# Patient Record
Sex: Female | Born: 1940 | Race: White | Hispanic: No | Marital: Married | Smoking: Former smoker
Health system: Southern US, Community
[De-identification: ages and names within clinical notes are randomized; demographics above are authoritative.]

## PROBLEM LIST (undated history)

## (undated) ENCOUNTER — Ambulatory Visit: Disposition: A | Discharge status: Home / Self Care | Payer: Medicare Other

## (undated) DIAGNOSIS — J449 Chronic obstructive pulmonary disease, unspecified: Secondary | ICD-10-CM

## (undated) DIAGNOSIS — K219 Gastro-esophageal reflux disease without esophagitis: Secondary | ICD-10-CM

## (undated) DIAGNOSIS — M1990 Unspecified osteoarthritis, unspecified site: Secondary | ICD-10-CM

## (undated) DIAGNOSIS — M329 Systemic lupus erythematosus, unspecified: Secondary | ICD-10-CM

## (undated) DIAGNOSIS — K224 Dyskinesia of esophagus: Secondary | ICD-10-CM

## (undated) HISTORY — PX: CHOLECYSTECTOMY: SHX55

## (undated) HISTORY — PX: ABDOMINAL HYSTERECTOMY: SHX81

---

## 2018-11-12 ENCOUNTER — Encounter: Payer: Self-pay | Admitting: Gynecology

## 2018-11-12 ENCOUNTER — Other Ambulatory Visit: Payer: Self-pay

## 2018-11-12 ENCOUNTER — Ambulatory Visit
Admission: EM | Admit: 2018-11-12 | Discharge: 2018-11-12 | Disposition: A | Discharge status: Home / Self Care | Payer: Medicare Other | Attending: Family Medicine | Admitting: Family Medicine

## 2018-11-12 DIAGNOSIS — J441 Chronic obstructive pulmonary disease with (acute) exacerbation: Secondary | ICD-10-CM | POA: Diagnosis not present

## 2018-11-12 DIAGNOSIS — Z87891 Personal history of nicotine dependence: Secondary | ICD-10-CM | POA: Diagnosis not present

## 2018-11-12 HISTORY — DX: Dyskinesia of esophagus: K22.4

## 2018-11-12 HISTORY — DX: Gastro-esophageal reflux disease without esophagitis: K21.9

## 2018-11-12 HISTORY — DX: Systemic lupus erythematosus, unspecified: M32.9

## 2018-11-12 HISTORY — DX: Chronic obstructive pulmonary disease, unspecified: J44.9

## 2018-11-12 HISTORY — DX: Unspecified osteoarthritis, unspecified site: M19.90

## 2018-11-12 MED ORDER — PREDNISONE 50 MG PO TABS: ORAL_TABLET | ORAL | 0 refills | Status: DC

## 2018-11-12 MED ORDER — AZITHROMYCIN 250 MG PO TABS: ORAL_TABLET | ORAL | 0 refills | Status: DC

## 2018-11-12 NOTE — ED Provider Notes (Signed)
[facility]    CSN: [id number] Arrival date & time: 11/12/18  1315  History   Chief Complaint Chief Complaint  Patient presents with  . Cough   HPI  77 year old female with a history of COPD presents with the above complaint.  Symptoms started Thursday.  Patient reports cough, congestion.  Severe. She reports that her cough has been more productive.  She has an increased production of mucus.  She reports associated shortness of breath.  Feels poorly.  No documented fever.  No chills.  She has been using her albuterol inhaler without resolution.  No known exacerbating factors.  No reported sick contacts.  No other associated symptoms.  No other complaints  PMH, Surgical Hx, Family Hx, Social History reviewed and updated as below.  Past Medical History:  Diagnosis Date  . Acid reflux disease   . Arthritis   . COPD (chronic obstructive pulmonary disease) (HCC)   . Esophageal spasm   . Lupus [facility](HCC)    Past Surgical History:  Procedure Laterality Date  . ABDOMINAL HYSTERECTOMY    . CESAREAN SECTION    . CHOLECYSTECTOMY      OB History   None      Home Medications    Prior to Admission medications   Medication Sig Start Date End Date Taking? Authorizing Provider  albuterol (PROVENTIL HFA;VENTOLIN HFA) 108 (90 Base) MCG/ACT inhaler Inhale into the lungs. 03/01/18  Yes [provider]  amLODipine (NORVASC) 5 MG tablet Take by mouth. 04/17/09  Yes [provider]  calcium-vitamin D (OSCAL 500/200 D-3) 500-200 MG-UNIT tablet Take by mouth. 04/17/09  Yes [provider]  Cholecalciferol (VITAMIN D3) 25 MCG (1000 UT) CAPS Take by mouth.   Yes [provider]  fexofenadine (ALLEGRA) 180 MG tablet Take by mouth. 01/03/14  Yes [provider]  hydroxychloroquine (PLAQUENIL) 200 MG tablet Take by mouth. 05/01/13  Yes [provider]  isosorbide mononitrate (IMDUR) 30 MG 24 hr tablet Take by mouth. 04/17/09  Yes [provider]  methotrexate (RHEUMATREX) 2.5 MG tablet Take by mouth. 12/04/13  Yes [provider]  nitroGLYCERIN (NITROSTAT) 0.4 MG SL tablet 1 tablet(s) sublingual as needed for chest pain (may repeat every 5 minutes but seek medical help if pain persists after 3 tablets) 04/17/09  Yes [provider]  azithromycin (ZITHROMAX) 250 MG tablet 2 tablets on day 1, then 1 tablet daily on days 2-5. 11/12/18   Tommie Samsook, Maisen Klingler G, DO  Biotin 1 MG CAPS Take by mouth.    [provider]  predniSONE (DELTASONE) 50 MG tablet 1 tablet daily x 5 days 11/12/18   Tommie Samsook, Ankit Degregorio G, DO    Family History Family History  Problem Relation Age of Onset  . Cancer Mother   . Cancer Father   . Cancer Sister     Social History Social History   Tobacco Use  . Smoking status: Former Games developermoker  . Smokeless tobacco: Never Used  Substance Use Topics  . Alcohol use: Never    Frequency: Never  . Drug use: Never     Allergies   Amoxicillin-pot clavulanate; Aspirin-dipyridamole er; Ciclesonide; Clopidogrel; Codeine; Doxycycline; and Tramadol   Review of Systems Review of Systems  Constitutional: Negative for fever.  Respiratory: Positive for cough and shortness of breath.    Physical Exam Triage Vital Signs ED Triage Vitals  Enc Vitals Group     BP 11/12/18 1346 123/63     Pulse Rate 11/12/18 1346 84  Resp 11/12/18 1346 16     Temp 11/12/18 1346 98.3 F (36.8 C)     Temp Source 11/12/18 1346 Oral     SpO2 11/12/18 1346 96 %     Weight 11/12/18 1338 178 lb (80.7 kg)     Height 11/12/18 1338 5' (1.524 m)     Head Circumference --      Peak Flow --      Pain Score 11/12/18 1338 7     Pain Loc --      Pain Edu? --      Excl. in GC? --    Updated Vital Signs BP 123/63 (BP Location: Left Arm)   Pulse 84   Temp 98.3 F (36.8 C) (Oral)   Resp 16   Ht 5' (1.524 m)   Wt 80.7 kg   SpO2 96%   BMI 34.76 kg/m   Visual Acuity Right Eye Distance:   Left Eye Distance:     Bilateral Distance:    Right Eye Near:   Left Eye Near:    Bilateral Near:     Physical Exam  Constitutional: She is oriented to person, place, and time. She appears well-developed. No distress.  HENT:  Head: Normocephalic and atraumatic.  Eyes: Conjunctivae are normal. Right eye exhibits no discharge. Left eye exhibits no discharge.  Cardiovascular: Normal rate and regular rhythm.  Pulmonary/Chest: Effort normal and breath sounds normal. She has no wheezes. She has no rales.  Neurological: She is alert and oriented to person, place, and time.  Psychiatric: She has a normal mood and affect. Her behavior is normal.  Nursing note and vitals reviewed.  UC Treatments / Results  Labs (all labs ordered are listed, but only abnormal results are displayed) Labs Reviewed - No data to display  EKG None  Radiology No results found.  Procedures Procedures (including critical care time)  Medications Ordered in UC Medications - No data to display  Initial Impression / Assessment and Plan / UC Course  I have reviewed the triage vital signs and the nursing notes.  Pertinent labs & imaging results that were available during my care of the patient were reviewed by me and considered in my medical decision making (see chart for details).    76 year old female presents with COPD exacerbation.  Treating with azithromycin and prednisone.  Final Clinical Impressions(s) / UC Diagnoses   Final diagnoses:  COPD exacerbation [facility])     Discharge Instructions     Medications as prescribed.  Take care  Dr. Adriana Simas    ED Prescriptions    Medication Sig Dispense Auth. Provider   azithromycin (ZITHROMAX) 250 MG tablet 2 tablets on day 1, then 1 tablet daily on days 2-5. 6 tablet Adanna Zuckerman G, DO   predniSONE (DELTASONE) 50 MG tablet 1 tablet daily x 5 days 5 tablet Tommie Sams, DO     Controlled Substance Prescriptions [place] Controlled Substance Registry consulted? Not Applicable    Tommie Sams, DO 11/12/18 1450

## 2018-11-12 NOTE — ED Triage Notes (Signed)
Patient c/o cough / chest congestion x 3 days.

## 2018-11-12 NOTE — Discharge Instructions (Signed)
Medications as prescribed. ° °Take care ° °Dr. Arneta Mahmood  °

## 2019-06-23 ENCOUNTER — Ambulatory Visit
Admission: EM | Admit: 2019-06-23 | Discharge: 2019-06-23 | Disposition: A | Discharge status: Home / Self Care | Payer: Medicare Other | Attending: Family Medicine | Admitting: Family Medicine

## 2019-06-23 ENCOUNTER — Other Ambulatory Visit: Payer: Self-pay

## 2019-06-23 ENCOUNTER — Encounter: Payer: Self-pay | Admitting: Emergency Medicine

## 2019-06-23 DIAGNOSIS — N390 Urinary tract infection, site not specified: Secondary | ICD-10-CM

## 2019-06-23 DIAGNOSIS — R319 Hematuria, unspecified: Secondary | ICD-10-CM

## 2019-06-23 LAB — URINALYSIS, COMPLETE (UACMP) WITH MICROSCOPIC
Bilirubin Urine: NEGATIVE
Glucose, UA: NEGATIVE mg/dL
Ketones, ur: NEGATIVE mg/dL
Nitrite: POSITIVE — AB
Protein, ur: 100 mg/dL — AB
RBC / HPF: >50 RBC/hpf (ref 0–5)
Specific Gravity, Urine: >1.03 — ABNORMAL HIGH (ref 1.005–1.030)
WBC, UA: >50 WBC/hpf (ref 0–5)
pH: 6.5 (ref 5.0–8.0)

## 2019-06-23 MED ORDER — NITROFURANTOIN MONOHYD MACRO 100 MG PO CAPS: 100.0000 mg | ORAL_CAPSULE | Freq: Two times a day (BID) | ORAL | 0 refills | Status: DC

## 2019-06-23 NOTE — Discharge Instructions (Signed)
Increase water intake

## 2019-06-23 NOTE — ED Triage Notes (Signed)
Patient c/o dysuria and urinary urgency that started last night.  

## 2019-06-23 NOTE — ED Provider Notes (Signed)
[facility]    CSN: [id number] Arrival date & time: 06/23/19  1736     History   Chief Complaint Chief Complaint  Patient presents with  . Dysuria  . Urinary Urgency    HPI Laurie Weiss is a 78 y.o. female.   78 yo female with a c/o burning with urination and urinary urgency since last night. Denies any fevers, chills, vomiting, abdominal pain.    Dysuria   Past Medical History:  Diagnosis Date  . Acid reflux disease   . Arthritis   . COPD (chronic obstructive pulmonary disease) ([place])   . Esophageal spasm   . Lupus ([place])     There are no active problems to display for this patient.   Past Surgical History:  Procedure Laterality Date  . ABDOMINAL HYSTERECTOMY    . CESAREAN SECTION    . CHOLECYSTECTOMY      OB History   No obstetric history on file.      Home Medications    Prior to Admission medications   Medication Sig Start Date End Date Taking? Authorizing Provider  albuterol (PROVENTIL HFA;VENTOLIN HFA) 108 (90 Base) MCG/ACT inhaler Inhale into the lungs. 03/01/18  Yes [provider]  amLODipine (NORVASC) 5 MG tablet Take by mouth. 04/17/09  Yes [provider]  azithromycin (ZITHROMAX) 250 MG tablet 2 tablets on day 1, then 1 tablet daily on days 2-5. 11/12/18  Yes Cook, Jayce G, DO  Biotin 1 MG CAPS Take by mouth.   Yes [provider]  calcium-vitamin D (OSCAL 500/200 D-3) 500-200 MG-UNIT tablet Take by mouth. 04/17/09  Yes [provider]  Cholecalciferol (VITAMIN D3) 25 MCG (1000 UT) CAPS Take by mouth.   Yes [provider]  fexofenadine (ALLEGRA) 180 MG tablet Take by mouth. 01/03/14  Yes [provider]  hydroxychloroquine (PLAQUENIL) 200 MG tablet Take by mouth. 05/01/13  Yes [provider]  isosorbide mononitrate (IMDUR) 30 MG 24 hr tablet Take by mouth. 04/17/09  Yes [provider]  methotrexate (RHEUMATREX) 2.5 MG tablet Take by mouth. 12/04/13  Yes [provider]  nitroGLYCERIN (NITROSTAT) 0.4 MG SL tablet 1 tablet(s) sublingual as needed for chest pain (may repeat every 5 minutes but seek medical help if pain persists after 3 tablets) 04/17/09  Yes [provider]  predniSONE (DELTASONE) 50 MG tablet 1 tablet daily x 5 days 11/12/18  Yes Cook, Jayce G, DO  nitrofurantoin, macrocrystal-monohydrate, (MACROBID) 100 MG capsule Take 1 capsule (100 mg total) by mouth 2 (two) times daily. 06/23/19   Norval Gable, MD    Family History Family History  Problem Relation Age of Onset  . Cancer Mother   . Cancer Father   . Cancer Sister     Social History Social History   Tobacco Use  . Smoking status: Former Research scientist (life sciences)  . Smokeless tobacco: Never Used  Substance Use Topics  . Alcohol use: Never    Frequency: Never  . Drug use: Never     Allergies   Amoxicillin-pot clavulanate, Aspirin-dipyridamole er, Ciclesonide, Clopidogrel, Codeine, Doxycycline, and Tramadol   Review of Systems Review of Systems  Genitourinary: Positive for dysuria.     Physical Exam Triage Vital Signs ED Triage Vitals  Enc Vitals Group     BP 06/23/19 1746 134/62     Pulse Rate 06/23/19 1746 83     Resp 06/23/19 1746 18     Temp 06/23/19 1746 98.3 F (36.8 C)     Temp  Source 06/23/19 1746 Oral     SpO2 06/23/19 1746 97 %     Weight 06/23/19 1744 181 lb (82.1 kg)     Height 06/23/19 1744 5' (1.524 m)     Head Circumference --      Peak Flow --      Pain Score 06/23/19 1743 8     Pain Loc --      Pain Edu? --      Excl. in GC? --    No data found.  Updated Vital Signs BP 134/62 (BP Location: Right Arm)   Pulse 83   Temp 98.3 F (36.8 C) (Oral)   Resp 18   Ht 5' (1.524 m)   Wt 82.1 kg   SpO2 97%   BMI 35.35 kg/m   Visual Acuity Right Eye Distance:   Left Eye Distance:   Bilateral Distance:    Right Eye Near:   Left Eye Near:    Bilateral Near:     Physical Exam Vitals signs and nursing note reviewed.  Constitutional:       General: She is not in acute distress.    Appearance: She is not toxic-appearing or diaphoretic.  Abdominal:     General: There is no distension.     Palpations: Abdomen is soft.     Tenderness: There is no abdominal tenderness. There is no right CVA tenderness or left CVA tenderness.  Neurological:     Mental Status: She is alert.      UC Treatments / Results  Labs (all labs ordered are listed, but only abnormal results are displayed) Labs Reviewed  URINALYSIS, COMPLETE (UACMP) WITH MICROSCOPIC - Abnormal; Notable for the following components:      Result Value   APPearance CLOUDY (*)    Specific Gravity, Urine >1.030 (*)    Hgb urine dipstick LARGE (*)    Protein, ur 100 (*)    Nitrite POSITIVE (*)    Leukocytes,Ua LARGE (*)    Bacteria, UA MANY (*)    All other components within normal limits  URINE CULTURE    EKG   Radiology No results found.  Procedures Procedures (including critical care time)  Medications Ordered in UC Medications - No data to display  Initial Impression / Assessment and Plan / UC Course  I have reviewed the triage vital signs and the nursing notes.  Pertinent labs & imaging results that were available during my care of the patient were reviewed by me and considered in my medical decision making (see chart for details).      Final Clinical Impressions(s) / UC Diagnoses   Final diagnoses:  Urinary tract infection with hematuria, site unspecified    ED Prescriptions    Medication Sig Dispense Auth. Provider   nitrofurantoin, macrocrystal-monohydrate, (MACROBID) 100 MG capsule Take 1 capsule (100 mg total) by mouth 2 (two) times daily. 14 capsule Payton Mccallumonty, Neo Yepiz, MD      1. Lab results and diagnosis reviewed with patient 2. rx as per orders above; reviewed possible side effects, interactions, risks and benefits  3. Recommend supportive treatment with increased water intake 4. Follow-up prn if symptoms worsen or don't improve    Controlled Substance Prescriptions [place] Controlled Substance Registry consulted? Not Applicable   Payton Mccallumonty, Blinda Turek, MD 06/23/19 [phone]

## 2019-06-26 LAB — URINE CULTURE
Culture: >=100000 — AB
Special Requests: NORMAL

## 2019-06-28 ENCOUNTER — Telehealth (HOSPITAL_COMMUNITY): Payer: Self-pay | Admitting: Emergency Medicine

## 2019-06-28 MED ORDER — CEPHALEXIN 500 MG PO CAPS: 500.0000 mg | ORAL_CAPSULE | Freq: Two times a day (BID) | ORAL | 0 refills | Status: AC

## 2019-06-28 NOTE — Telephone Encounter (Signed)
Urine culture was positive for e coli resistant to macrobid  given at urgent care visit. Prescription for keflex per Dr.hagler sent to pharmacy of choice. Pt called and made aware. Pt educated to follow up if symptoms are not improving. Verbalized understanding.

## 2019-11-25 ENCOUNTER — Emergency Department: Payer: Medicare Other

## 2019-11-25 ENCOUNTER — Other Ambulatory Visit: Payer: Self-pay

## 2019-11-25 ENCOUNTER — Emergency Department
Admission: EM | Admit: 2019-11-25 | Discharge: 2019-11-26 | Disposition: A | Discharge status: Home / Self Care | Payer: Medicare Other | Attending: Emergency Medicine | Admitting: Emergency Medicine

## 2019-11-25 DIAGNOSIS — Z79899 Other long term (current) drug therapy: Secondary | ICD-10-CM | POA: Insufficient documentation

## 2019-11-25 DIAGNOSIS — R0602 Shortness of breath: Secondary | ICD-10-CM | POA: Diagnosis present

## 2019-11-25 DIAGNOSIS — J441 Chronic obstructive pulmonary disease with (acute) exacerbation: Secondary | ICD-10-CM | POA: Insufficient documentation

## 2019-11-25 DIAGNOSIS — U071 COVID-19: Secondary | ICD-10-CM | POA: Diagnosis not present

## 2019-11-25 DIAGNOSIS — Z87891 Personal history of nicotine dependence: Secondary | ICD-10-CM | POA: Diagnosis not present

## 2019-11-25 DIAGNOSIS — J449 Chronic obstructive pulmonary disease, unspecified: Secondary | ICD-10-CM

## 2019-11-25 LAB — COMPREHENSIVE METABOLIC PANEL
ALT: 26 U/L (ref 0–44)
AST: 39 U/L (ref 15–41)
Albumin: 3.7 g/dL (ref 3.5–5.0)
Alkaline Phosphatase: 52 U/L (ref 38–126)
Anion gap: 8 (ref 5–15)
BUN: 9 mg/dL (ref 8–23)
CO2: 25 mmol/L (ref 22–32)
Calcium: 8.6 mg/dL — ABNORMAL LOW (ref 8.9–10.3)
Chloride: 105 mmol/L (ref 98–111)
Creatinine, Ser: 0.83 mg/dL (ref 0.44–1.00)
GFR calc Af Amer: >60 mL/min (ref >60)
GFR calc non Af Amer: >60 mL/min (ref >60)
Glucose, Bld: 88 mg/dL (ref 70–99)
Potassium: 4 mmol/L (ref 3.5–5.1)
Sodium: 138 mmol/L (ref 135–145)
Total Bilirubin: 1 mg/dL (ref 0.3–1.2)
Total Protein: 7.4 g/dL (ref 6.5–8.1)

## 2019-11-25 LAB — PROTIME-INR
INR: 1 (ref 0.8–1.2)
Prothrombin Time: 13.4 seconds (ref 11.4–15.2)

## 2019-11-25 LAB — CBC WITH DIFFERENTIAL/PLATELET
Abs Immature Granulocytes: 0.01 10*3/uL (ref 0.00–0.07)
Basophils Absolute: 0 10*3/uL (ref 0.0–0.1)
Basophils Relative: 0 %
Eosinophils Absolute: 0 10*3/uL (ref 0.0–0.5)
Eosinophils Relative: 1 %
HCT: 39.1 % (ref 36.0–46.0)
Hemoglobin: 13.2 g/dL (ref 12.0–15.0)
Immature Granulocytes: 0 %
Lymphocytes Relative: 17 %
Lymphs Abs: 0.5 10*3/uL — ABNORMAL LOW (ref 0.7–4.0)
MCH: 30.3 pg (ref 26.0–34.0)
MCHC: 33.8 g/dL (ref 30.0–36.0)
MCV: 89.9 fL (ref 80.0–100.0)
Monocytes Absolute: 0.3 10*3/uL (ref 0.1–1.0)
Monocytes Relative: 10 %
Neutro Abs: 2 10*3/uL (ref 1.7–7.7)
Neutrophils Relative %: 72 %
Platelets: 165 10*3/uL (ref 150–400)
RBC: 4.35 MIL/uL (ref 3.87–5.11)
RDW: 13.9 % (ref 11.5–15.5)
WBC: 2.8 10*3/uL — ABNORMAL LOW (ref 4.0–10.5)
nRBC: 0 % (ref 0.0–0.2)

## 2019-11-25 LAB — POC SARS CORONAVIRUS 2 AG: SARS Coronavirus 2 Ag: POSITIVE — AB

## 2019-11-25 LAB — PROCALCITONIN: Procalcitonin: <0.1 ng/mL

## 2019-11-25 LAB — TROPONIN I (HIGH SENSITIVITY): Troponin I (High Sensitivity): 65 ng/L — ABNORMAL HIGH (ref <18)

## 2019-11-25 LAB — BRAIN NATRIURETIC PEPTIDE: B Natriuretic Peptide: 66 pg/mL (ref 0.0–100.0)

## 2019-11-25 MED ORDER — SODIUM CHLORIDE 0.9 % IV BOLUS
500.0000 mL | Freq: Once | INTRAVENOUS | Status: AC
  Administered 2019-11-25: 20:00:00 500 mL via INTRAVENOUS

## 2019-11-25 MED ORDER — ALBUTEROL SULFATE HFA 108 (90 BASE) MCG/ACT IN AERS: 1.0000 | INHALATION_SPRAY | Freq: Four times a day (QID) | RESPIRATORY_TRACT | 1 refills | Status: DC | PRN

## 2019-11-25 MED ORDER — BENZONATATE 100 MG PO CAPS
100.0000 mg | ORAL_CAPSULE | Freq: Once | ORAL | Status: AC
  Administered 2019-11-25: 100 mg via ORAL
  Filled 2019-11-25: qty 1

## 2019-11-25 MED ORDER — PREDNISONE 20 MG PO TABS: 40.0000 mg | ORAL_TABLET | Freq: Every day | ORAL | 0 refills | Status: AC

## 2019-11-25 MED ORDER — BENZONATATE 100 MG PO CAPS: 100.0000 mg | ORAL_CAPSULE | Freq: Three times a day (TID) | ORAL | 0 refills | Status: AC | PRN

## 2019-11-25 NOTE — ED Notes (Signed)
Pt up and ambulated around in the room on room air and O2 sats were 94 to 95 %. Dr Jari Pigg informed. Pt did not feel short of breath with walking.

## 2019-11-25 NOTE — ED Notes (Signed)
Repeat lt green and blue sent to lab

## 2019-11-25 NOTE — Discharge Instructions (Addendum)
You are positive for coronavirus.  You should stay quarantine at home for 10 days.  Take Tylenol to help with your fevers and aches.  You can use the tessalon pearles to help with cough. We are starting you on some prednisone given your COPD as well as a inhaler for your COPD.  Return to the ER if you develop worsening shortness of breath or have any other concerns.     Person Under Monitoring Name: Laurie Weiss  Location: [address] [place] [id number]   Infection Prevention Recommendations for Individuals Confirmed to have, or Being Evaluated for, 2019 Novel Coronavirus (COVID-19) Infection Who Receive Care at Home  Individuals who are confirmed to have, or are being evaluated for, COVID-19 should follow the prevention steps below until a healthcare provider or local or state health department says they can return to normal activities.  Stay home except to get medical care You should restrict activities outside your home, except for getting medical care. Do not go to work, school, or public areas, and do not use public transportation or taxis.  Call ahead before visiting your doctor Before your medical appointment, call the healthcare provider and tell them that you have, or are being evaluated for, COVID-19 infection. This will help the healthcare provider's office take steps to keep other people from getting infected. Ask your healthcare provider to call the local or state health department.  Monitor your symptoms Seek prompt medical attention if your illness is worsening (e.g., difficulty breathing). Before going to your medical appointment, call the healthcare provider and tell them that you have, or are being evaluated for, COVID-19 infection. Ask your healthcare provider to call the local or state health department.  Wear a facemask You should wear a facemask that covers your nose and mouth when you are in the same room with other people and when you visit a  healthcare provider. People who live with or visit you should also wear a facemask while they are in the same room with you.  Separate yourself from other people in your home As much as possible, you should stay in a different room from other people in your home. Also, you should use a separate bathroom, if available.  Avoid sharing household items You should not share dishes, drinking glasses, cups, eating utensils, towels, bedding, or other items with other people in your home. After using these items, you should wash them thoroughly with soap and water.  Cover your coughs and sneezes Cover your mouth and nose with a tissue when you cough or sneeze, or you can cough or sneeze into your sleeve. Throw used tissues in a lined trash can, and immediately wash your hands with soap and water for at least 20 seconds or use an alcohol-based hand rub.  Wash your [employer] Pacific Corporationhands Wash your hands often and thoroughly with soap and water for at least 20 seconds. You can use an alcohol-based hand sanitizer if soap and water are not available and if your hands are not visibly dirty. Avoid touching your eyes, nose, and mouth with unwashed hands.   Prevention Steps for Caregivers and Household Members of Individuals Confirmed to have, or Being Evaluated for, COVID-19 Infection Being Cared for in the Home  If you live with, or provide care at home for, a person confirmed to have, or being evaluated for, COVID-19 infection please follow these guidelines to prevent infection:  Follow healthcare provider's instructions Make sure that you understand and can help the patient follow  any healthcare provider instructions for all care.  Provide for the patient's basic needs You should help the patient with basic needs in the home and provide support for getting groceries, prescriptions, and other personal needs.  Monitor the patient's symptoms If they are getting sicker, call his or her medical provider and tell  them that the patient has, or is being evaluated for, COVID-19 infection. This will help the healthcare provider's office take steps to keep other people from getting infected. Ask the healthcare provider to call the local or state health department.  Limit the number of people who have contact with the patient If possible, have only one caregiver for the patient. Other household members should stay in another home or place of residence. If this is not possible, they should stay in another room, or be separated from the patient as much as possible. Use a separate bathroom, if available. Restrict visitors who do not have an essential need to be in the home.  Keep older adults, very young children, and other sick people away from the patient Keep older adults, very young children, and those who have compromised immune systems or chronic health conditions away from the patient. This includes people with chronic heart, lung, or kidney conditions, diabetes, and cancer.  Ensure good ventilation Make sure that shared spaces in the home have good air flow, such as from an air conditioner or an opened window, weather permitting.  Wash your hands often Wash your hands often and thoroughly with soap and water for at least 20 seconds. You can use an alcohol based hand sanitizer if soap and water are not available and if your hands are not visibly dirty. Avoid touching your eyes, nose, and mouth with unwashed hands. Use disposable paper towels to dry your hands. If not available, use dedicated cloth towels and replace them when they become wet.  Wear a facemask and gloves Wear a disposable facemask at all times in the room and gloves when you touch or have contact with the patient's blood, body fluids, and/or secretions or excretions, such as sweat, saliva, sputum, nasal mucus, vomit, urine, or feces.  Ensure the mask fits over your nose and mouth tightly, and do not touch it during use. Throw out  disposable facemasks and gloves after using them. Do not reuse. Wash your hands immediately after removing your facemask and gloves. If your personal clothing becomes contaminated, carefully remove clothing and launder. Wash your hands after handling contaminated clothing. Place all used disposable facemasks, gloves, and other waste in a lined container before disposing them with other household waste. Remove gloves and wash your hands immediately after handling these items.  Do not share dishes, glasses, or other household items with the patient Avoid sharing household items. You should not share dishes, drinking glasses, cups, eating utensils, towels, bedding, or other items with a patient who is confirmed to have, or being evaluated for, COVID-19 infection. After the person uses these items, you should wash them thoroughly with soap and water.  Wash laundry thoroughly Immediately remove and wash clothes or bedding that have blood, body fluids, and/or secretions or excretions, such as sweat, saliva, sputum, nasal mucus, vomit, urine, or feces, on them. Wear gloves when handling laundry from the patient. Read and follow directions on labels of laundry or clothing items and detergent. In general, wash and dry with the warmest temperatures recommended on the label.  Clean all areas the individual has used often Clean all touchable surfaces, such as counters,  tabletops, doorknobs, bathroom fixtures, toilets, phones, keyboards, tablets, and bedside tables, every day. Also, clean any surfaces that may have blood, body fluids, and/or secretions or excretions on them. Wear gloves when cleaning surfaces the patient has come in contact with. Use a diluted bleach solution (e.g., dilute bleach with 1 part bleach and 10 parts water) or a household disinfectant with a label that says EPA-registered for coronaviruses. To make a bleach solution at home, add 1 tablespoon of bleach to 1 quart (4 cups) of water.  For a larger supply, add  cup of bleach to 1 gallon (16 cups) of water. Read labels of cleaning products and follow recommendations provided on product labels. Labels contain instructions for safe and effective use of the cleaning product including precautions you should take when applying the product, such as wearing gloves or eye protection and making sure you have good ventilation during use of the product. Remove gloves and wash hands immediately after cleaning.  Monitor yourself for signs and symptoms of illness Caregivers and household members are considered close contacts, should monitor their health, and will be asked to limit movement outside of the home to the extent possible. Follow the monitoring steps for close contacts listed on the symptom monitoring form.   ? If you have additional questions, contact your local health department or call the epidemiologist on call at [phone] (available 24/7). ? This guidance is subject to change. For the most up-to-date guidance from [facility], please refer to their website: [email or web address]

## 2019-11-25 NOTE — ED Triage Notes (Signed)
Pt arrives from home via EMS after having SHOB startind today- pt's husband tested positive on Dec 3rd but pt tested negative- pt states she has had a cough, SHOB, loss of taste and smell

## 2019-11-25 NOTE — ED Notes (Addendum)
Call bell answered, pt assisted to toilet, diaper changed, infusion completed, tv on and lights dimmed for comfort

## 2019-11-25 NOTE — ED Notes (Signed)
X-ray at bedside

## 2019-11-25 NOTE — ED Provider Notes (Signed)
[facility] Emergency Department Provider Note  ____________________________________________   First MD Initiated Contact with Patient 11/25/19 1629     (approximate)  I have reviewed the triage vital signs and the nursing notes.   HISTORY  Chief Complaint Shortness of Breath    HPI Laurie Weiss is a 78 y.o. female with COPD, lupus, rheumatoid arthritis who comes in for shortness of breath.  Patient states she is on methotrexate but she takes that once a week.  She otherwise is not on chronic steroids.  Patient husband tested positive on December 3 but patient's test was negative.  Patient states that she lost the sense of taste and smell.  She also has associated cough and shortness of breath.  She states she started to feel like she had the flu on December 5 or 6.  She presented today due to a coughing fit.  She states that about 2 hours prior to presentation that she started coughing and she felt really short of breath and she could not catch it which is why she presented.  The shortness of breath was severe, occurred at one time, lasted few minutes and then resolved.  She states now she denies any shortness of breath or chest pain or coughing.  She denies any abdominal pain.          Past Medical History:  Diagnosis Date  . Acid reflux disease   . Arthritis   . COPD (chronic obstructive pulmonary disease) ([place])   . Esophageal spasm   . Lupus ([place])     There are no problems to display for this patient.   Past Surgical History:  Procedure Laterality Date  . ABDOMINAL HYSTERECTOMY    . CESAREAN SECTION    . CHOLECYSTECTOMY      Prior to Admission medications   Medication Sig Start Date End Date Taking? Authorizing Provider  albuterol (PROVENTIL HFA;VENTOLIN HFA) 108 (90 Base) MCG/ACT inhaler Inhale into the lungs. 03/01/18   [provider]  amLODipine (NORVASC) 5 MG tablet Take by mouth. 04/17/09   [provider]    azithromycin (ZITHROMAX) 250 MG tablet 2 tablets on day 1, then 1 tablet daily on days 2-5. 11/12/18   Coral Spikes, DO  Biotin 1 MG CAPS Take by mouth.    [provider]  calcium-vitamin D (OSCAL 500/200 D-3) 500-200 MG-UNIT tablet Take by mouth. 04/17/09   [provider]  Cholecalciferol (VITAMIN D3) 25 MCG (1000 UT) CAPS Take by mouth.    [provider]  fexofenadine (ALLEGRA) 180 MG tablet Take by mouth. 01/03/14   [provider]  hydroxychloroquine (PLAQUENIL) 200 MG tablet Take by mouth. 05/01/13   [provider]  isosorbide mononitrate (IMDUR) 30 MG 24 hr tablet Take by mouth. 04/17/09   [provider]  methotrexate (RHEUMATREX) 2.5 MG tablet Take by mouth. 12/04/13   [provider]  nitrofurantoin, macrocrystal-monohydrate, (MACROBID) 100 MG capsule Take 1 capsule (100 mg total) by mouth 2 (two) times daily. 06/23/19   Norval Gable, MD  nitroGLYCERIN (NITROSTAT) 0.4 MG SL tablet 1 tablet(s) sublingual as needed for chest pain (may repeat every 5 minutes but seek medical help if pain persists after 3 tablets) 04/17/09   [provider]  predniSONE (DELTASONE) 50 MG tablet 1 tablet daily x 5 days 11/12/18   Coral Spikes, DO    Allergies Amoxicillin-pot clavulanate, Aspirin-dipyridamole er, Ciclesonide, Clopidogrel, Codeine, Doxycycline, and Tramadol  Family History  Problem Relation Age of  Onset  . Cancer Mother   . Cancer Father   . Cancer Sister     Social History Social History   Tobacco Use  . Smoking status: Former Games developermoker  . Smokeless tobacco: Never Used  Substance Use Topics  . Alcohol use: Never  . Drug use: Never      Review of Systems Constitutional: No fever/chills Eyes: No visual changes. ENT: No sore throat. Cardiovascular: No chest pain Respiratory: Positive for SOB, coughing Gastrointestinal: No abdominal pain.  No nausea, no vomiting.  No diarrhea.  No  constipation. Genitourinary: Negative for dysuria. Musculoskeletal: Negative for back pain. Skin: Negative for rash. Neurological: Negative for headaches, focal weakness or numbness. All other ROS negative ____________________________________________   PHYSICAL EXAM:  VITAL SIGNS: ED Triage Vitals  Enc Vitals Group     BP 11/25/19 1625 124/66     Pulse Rate 11/25/19 1615 90     Resp 11/25/19 1615 (!) 27     Temp 11/25/19 1615 98.6 F (37 C)     Temp Source 11/25/19 1615 Oral     SpO2 11/25/19 1612 97 %     Weight 11/25/19 1616 172 lb (78 kg)     Height 11/25/19 1616 5' (1.524 m)     Head Circumference --      Peak Flow --      Pain Score 11/25/19 1616 0     Pain Loc --      Pain Edu? --      Excl. in GC? --     Constitutional: Alert and oriented. Well appearing and in no acute distress. Eyes: Conjunctivae are normal. EOMI. Head: Atraumatic. Nose: No congestion/rhinnorhea. Mouth/Throat: Mucous membranes are moist.   Neck: No stridor. Trachea Midline. FROM Cardiovascular: Normal rate, regular rhythm. Grossly normal heart sounds.  Good peripheral circulation. Respiratory: No wheezing, no increased work of breathing, on room air Gastrointestinal: Soft and nontender. No distention. No abdominal bruits.  Musculoskeletal: No lower extremity tenderness nor edema.  No joint effusions. Neurologic:  Normal speech and language. No gross focal neurologic deficits are appreciated.  Skin:  Skin is warm, dry and intact. No rash noted. Psychiatric: Mood and affect are normal. Speech and behavior are normal. GU: Deferred   ____________________________________________   LABS (all labs ordered are listed, but only abnormal results are displayed)  Labs Reviewed  CBC WITH DIFFERENTIAL/PLATELET - Abnormal; Notable for the following components:      Result Value   WBC 2.8 (*)    Lymphs Abs 0.5 (*)    All other components within normal limits  COMPREHENSIVE METABOLIC PANEL - Abnormal;  Notable for the following components:   Calcium 8.6 (*)    All other components within normal limits  POC SARS CORONAVIRUS 2 AG - Abnormal; Notable for the following components:   SARS Coronavirus 2 Ag POSITIVE (*)    All other components within normal limits  TROPONIN I (HIGH SENSITIVITY) - Abnormal; Notable for the following components:   Troponin I (High Sensitivity) 65 (*)    All other components within normal limits  BRAIN NATRIURETIC PEPTIDE  PROTIME-INR  PROCALCITONIN  PROCALCITONIN  POC SARS CORONAVIRUS 2 AG -  ED  TROPONIN I (HIGH SENSITIVITY)   ____________________________________________   ED ECG REPORT I, Concha SeMary E Romina Divirgilio, the attending physician, personally viewed and interpreted this ECG.  EKG is normal sinus rate of 90, no ST elevations, T wave inversion aVL, normal intervals ____________________________________________  RADIOLOGY Vela ProseI, Tabria Steines E Vong Garringer, personally viewed and evaluated  these images (plain radiographs) as part of my medical decision making, as well as reviewing the written report by the radiologist.  ED MD interpretation: Bilateral opacities consistent with Covid.  Official radiology report(s): DG Chest Portable 1 View  Result Date: 11/25/2019 CLINICAL DATA:  SOB starting today, cough. Per ER notes: husband tested positive on Dec 3rd but pt tested negative. HX of COPD, former smoker. EXAM: PORTABLE CHEST 1 VIEW COMPARISON:  None. FINDINGS: Normal mediastinal contours. Heart size is upper limits of normal. Aortic arch calcification. There are diffuse bilateral coarse interstitial opacities likely reflecting chronic bronchitis. No focal infiltrate. No pneumothorax or large pleural effusion. No acute finding in the visualized skeleton. IMPRESSION: Diffuse bilateral coarse interstitial opacities likely reflecting chronic bronchitis, though a superimposed acute interstitial/viral process would be difficult to exclude. No focal infiltrate. Electronically Signed   By:  Emmaline Kluver M.D.   On: 11/25/2019 17:24    ____________________________________________   PROCEDURES  Procedure(s) performed (including Critical Care):  Procedures   ____________________________________________   INITIAL IMPRESSION / ASSESSMENT AND PLAN / ED COURSE   Haydan Wedig was evaluated in Emergency Department on 11/25/2019 for the symptoms described in the history of present illness. She was evaluated in the context of the global COVID-19 pandemic, which necessitated consideration that the patient might be at risk for infection with the SARS-CoV-2 virus that causes COVID-19. Institutional protocols and algorithms that pertain to the evaluation of patients at risk for COVID-19 are in a state of rapid change based on information released by regulatory bodies including the CDC and federal and state organizations. These policies and algorithms were followed during the patient's care in the ED.     Pt presents with SOB.  This is most concerning for coronavirus. PNA-will get xray to evaluation Anemia-CBC to evaluate ACS- will get trops Arrhythmia-Will get EKG and keep on monitor.  PE-lower suspicion given no risk factors and other cause more likely   Chest x-ray consistent with Covid.  Ambulatory sat was greater than 94%.  Patient's white count was slightly low but her procalcitonin was negative and she is afebrile so I do not think she needs antibiotics.  Her troponin was elevated at 65 although her EKG had no changes.  Will get repeat to make sure that it stable.  More likely just secondary to infectious cause unless greatly increasing.  I reevaluated patient she is having no chest pain or shortness of breath.  She feels comfortable going home.  She is requesting a refill of her inhaler for her COPD.  Also give a short course of prednisone given she has COPD.   Patient handed off pending repeat troponin and if stable discharge home.     ____________________________________________   FINAL CLINICAL IMPRESSION(S) / ED DIAGNOSES   Final diagnoses:  COVID-19 virus detected  Chronic obstructive pulmonary disease, unspecified COPD type (HCC)     MEDICATIONS GIVEN DURING THIS VISIT:  Medications  benzonatate (TESSALON) capsule 100 mg (100 mg Oral Given 11/25/19 1753)  sodium chloride 0.9 % bolus 500 mL (0 mLs Intravenous Stopped 11/25/19 2150)     ED Discharge Orders         Ordered    predniSONE (DELTASONE) 20 MG tablet  Daily     11/25/19 2238    benzonatate (TESSALON PERLES) 100 MG capsule  3 times daily PRN     11/25/19 2238    albuterol (VENTOLIN HFA) 108 (90 Base) MCG/ACT inhaler  Every 6 hours PRN  11/25/19 2238           Note:  This document was prepared using Dragon voice recognition software and may include unintentional dictation errors.   Concha Se, MD 11/25/19 2241

## 2019-11-25 NOTE — ED Notes (Signed)
Pt given blankets and brief changed

## 2019-11-26 LAB — TROPONIN I (HIGH SENSITIVITY): Troponin I (High Sensitivity): 65 ng/L — ABNORMAL HIGH (ref <18)

## 2019-11-26 NOTE — ED Provider Notes (Signed)
-----------------------------------------   12:39 AM on 11/26/2019 -----------------------------------------  Repeat troponin remains unchanged.  Will discharge home per previous provider.  Strict return precautions given.  Patient verbalizes understanding and agrees with plan of care.   Paulette Blanch, MD 11/26/19 [phone]

## 2019-11-26 NOTE — ED Notes (Signed)
Patients son called to pick up pt

## 2020-12-26 ENCOUNTER — Ambulatory Visit
Admission: EM | Admit: 2020-12-26 | Discharge: 2020-12-26 | Disposition: A | Discharge status: Home / Self Care | Payer: Medicare Other | Attending: Physician Assistant | Admitting: Physician Assistant

## 2020-12-26 ENCOUNTER — Encounter: Payer: Self-pay | Admitting: Emergency Medicine

## 2020-12-26 ENCOUNTER — Other Ambulatory Visit: Payer: Self-pay

## 2020-12-26 ENCOUNTER — Ambulatory Visit (INDEPENDENT_AMBULATORY_CARE_PROVIDER_SITE_OTHER): Payer: Medicare Other

## 2020-12-26 DIAGNOSIS — U071 COVID-19: Secondary | ICD-10-CM | POA: Insufficient documentation

## 2020-12-26 DIAGNOSIS — R059 Cough, unspecified: Secondary | ICD-10-CM

## 2020-12-26 DIAGNOSIS — R0602 Shortness of breath: Secondary | ICD-10-CM

## 2020-12-26 DIAGNOSIS — R0981 Nasal congestion: Secondary | ICD-10-CM

## 2020-12-26 DIAGNOSIS — J441 Chronic obstructive pulmonary disease with (acute) exacerbation: Secondary | ICD-10-CM | POA: Diagnosis not present

## 2020-12-26 MED ORDER — ALBUTEROL SULFATE HFA 108 (90 BASE) MCG/ACT IN AERS: 1.0000 | INHALATION_SPRAY | Freq: Four times a day (QID) | RESPIRATORY_TRACT | 1 refills | Status: AC | PRN

## 2020-12-26 MED ORDER — AZITHROMYCIN 250 MG PO TABS: ORAL_TABLET | ORAL | 0 refills | Status: DC

## 2020-12-26 MED ORDER — PREDNISONE 20 MG PO TABS: 40.0000 mg | ORAL_TABLET | Freq: Every day | ORAL | 0 refills | Status: AC

## 2020-12-26 NOTE — ED Provider Notes (Addendum)
[facility]    CSN: [id number] Arrival date & time: 12/26/20  1020      History   Chief Complaint Chief Complaint  Patient presents with  . Cough  . Shortness of Breath    HPI Laurie Weiss is a 80 y.o. female presenting for 1 week history of dry cough and increased SOB. She has history of COPD, lupus, hypertension, hyperlipidemia, RA, and GERD with esophageal spasms. Patient admits to personal history of COVID 90 in December 2020. Has been fully vaccinated for COVID.  Patient says that she does not have an albuterol inhaler and requests a refill today.  Patient states that she does not feel that badly right now, but since she has COPD she is afraid that she could get worse.  Denies any fever, body aches, chest pain, sore throat, nasal congestion, abdominal pain, nausea/vomiting or diarrhea.  She does not report any changes in smell and taste.  She has been taking over-the-counter cough syrup with some improvement in her cough.  She has no other complaints or concerns.  HPI  Past Medical History:  Diagnosis Date  . Acid reflux disease   . Arthritis   . COPD (chronic obstructive pulmonary disease) (HCC)   . Esophageal spasm   . Lupus (HCC)     There are no problems to display for this patient.   Past Surgical History:  Procedure Laterality Date  . ABDOMINAL HYSTERECTOMY    . CESAREAN SECTION    . CHOLECYSTECTOMY      OB History   No obstetric history on file.      Home Medications    Prior to Admission medications   Medication Sig Start Date End Date Taking? Authorizing Provider  predniSONE (DELTASONE) 20 MG tablet Take 2 tablets (40 mg total) by mouth daily for 5 days. 12/26/20 12/31/20 Yes Shirlee Latch, PA-C  albuterol (VENTOLIN HFA) 108 (90 Base) MCG/ACT inhaler Inhale 1-2 puffs into the lungs every 6 (six) hours as needed for wheezing or shortness of breath. 12/26/20 01/25/21  Eusebio Friendly B, PA-C  amLODipine (NORVASC) 5 MG tablet Take by mouth.  04/17/09   [provider]  azithromycin (ZITHROMAX) 250 MG tablet 2 tablets on day 1, then 1 tablet daily on days 2-5. 12/26/20   Shirlee Latch, PA-C  Biotin 1 MG CAPS Take by mouth.    [provider]  calcium-vitamin D (OSCAL 500/200 D-3) 500-200 MG-UNIT tablet Take by mouth. 04/17/09   [provider]  Cholecalciferol (VITAMIN D3) 25 MCG (1000 UT) CAPS Take by mouth.    [provider]  fexofenadine (ALLEGRA) 180 MG tablet Take by mouth. 01/03/14   [provider]  hydroxychloroquine (PLAQUENIL) 200 MG tablet Take by mouth. 05/01/13   [provider]  isosorbide mononitrate (IMDUR) 30 MG 24 hr tablet Take by mouth. 04/17/09   [provider]  methotrexate (RHEUMATREX) 2.5 MG tablet Take by mouth. 12/04/13   [provider]  nitrofurantoin, macrocrystal-monohydrate, (MACROBID) 100 MG capsule Take 1 capsule (100 mg total) by mouth 2 (two) times daily. 06/23/19   Payton Mccallum, MD  nitroGLYCERIN (NITROSTAT) 0.4 MG SL tablet 1 tablet(s) sublingual as needed for chest pain (may repeat every 5 minutes but seek medical help if pain persists after 3 tablets) 04/17/09   [provider]    Family History Family History  Problem Relation Age of Onset  . Cancer Mother   . Cancer Father   . Cancer Sister  Social History Social History   Tobacco Use  . Smoking status: Former Games developer  . Smokeless tobacco: Never Used  Vaping Use  . Vaping Use: Never used  Substance Use Topics  . Alcohol use: Never  . Drug use: Never     Allergies   Amoxicillin-pot clavulanate, Aspirin-dipyridamole er, Ciclesonide, Clopidogrel, Codeine, Doxycycline, and Tramadol   Review of Systems Review of Systems  Constitutional: Positive for fatigue. Negative for chills, diaphoresis and fever.  HENT: Negative for congestion, ear pain, rhinorrhea, sinus pressure, sinus pain and sore throat.   Respiratory: Positive for cough and shortness of  breath. Negative for wheezing.   Cardiovascular: Negative for chest pain.  Gastrointestinal: Negative for abdominal pain, nausea and vomiting.  Musculoskeletal: Negative for arthralgias and myalgias.  Skin: Negative for rash.  Neurological: Negative for weakness and headaches.  Hematological: Negative for adenopathy.     Physical Exam Triage Vital Signs ED Triage Vitals  Enc Vitals Group     BP 12/26/20 1054 (!) 155/69     Pulse Rate 12/26/20 1054 65     Resp 12/26/20 1054 15     Temp 12/26/20 1054 97.9 F (36.6 C)     Temp Source 12/26/20 1054 Oral     SpO2 12/26/20 1054 95 %     Weight 12/26/20 1051 170 lb (77.1 kg)     Height 12/26/20 1051 5' (1.524 m)     Head Circumference --      Peak Flow --      Pain Score 12/26/20 1051 0     Pain Loc --      Pain Edu? --      Excl. in GC? --    No data found.  Updated Vital Signs BP (!) 155/69 (BP Location: Left Arm)   Pulse 65   Temp 97.9 F (36.6 C) (Oral)   Resp 15   Ht 5' (1.524 m)   Wt 170 lb (77.1 kg)   SpO2 95%   BMI 33.20 kg/m    Physical Exam Vitals and nursing note reviewed.  Constitutional:      General: She is not in acute distress.    Appearance: Normal appearance. She is not ill-appearing or toxic-appearing.  HENT:     Head: Normocephalic and atraumatic.     Right Ear: Tympanic membrane, ear canal and external ear normal.     Left Ear: Tympanic membrane, ear canal and external ear normal.     Nose: Nose normal.     Mouth/Throat:     Mouth: Mucous membranes are moist.     Pharynx: Oropharynx is clear.  Eyes:     General: No scleral icterus.       Right eye: No discharge.        Left eye: No discharge.     Conjunctiva/sclera: Conjunctivae normal.  Cardiovascular:     Rate and Rhythm: Normal rate and regular rhythm.     Heart sounds: Normal heart sounds.  Pulmonary:     Effort: Pulmonary effort is normal. No respiratory distress.     Breath sounds: Normal breath sounds. No wheezing, rhonchi or  rales.  Musculoskeletal:     Cervical back: Neck supple.  Skin:    General: Skin is dry.  Neurological:     General: No focal deficit present.     Mental Status: She is alert. Mental status is at baseline.     Motor: No weakness.     Gait: Gait normal.  Psychiatric:  Mood and Affect: Mood normal.        Behavior: Behavior normal.        Thought Content: Thought content normal.      UC Treatments / Results  Labs (all labs ordered are listed, but only abnormal results are displayed) Labs Reviewed  SARS CORONAVIRUS 2 (TAT 6-24 HRS)    EKG   Radiology DG Chest 2 View  Result Date: 12/26/2020 CLINICAL DATA:  Cough and congestion with shortness of breath EXAM: CHEST - 2 VIEW COMPARISON:  November 25, 2019 FINDINGS: There is minimal scarring in the left base. The lungs elsewhere are clear. Heart size and pulmonary vascularity are normal. No adenopathy. There is aortic atherosclerosis. There is mild degenerative change in the thoracic spine. IMPRESSION: Slight scarring left base. No edema or airspace opacity. Heart size normal. Aortic Atherosclerosis (ICD10-I70.0). Electronically Signed   By: Bretta Bang III M.D.   On: 12/26/2020 11:36    Procedures Procedures (including critical care time)  Medications Ordered in UC Medications - No data to display  Initial Impression / Assessment and Plan / UC Course  I have reviewed the triage vital signs and the nursing notes.  Pertinent labs & imaging results that were available during my care of the patient were reviewed by me and considered in my medical decision making (see chart for details).   80 year old female presenting for nearly 1 week history of cough and increased shortness of breath.  Has history of COPD and does not have an inhaler.  She is in no acute distress.  Blood pressure slightly elevated at 155/69.  Exam is benign.  Chest clear to auscultation heart regular rate and rhythm.  Chest x-ray performed due to  complaint of shortness of breath. Independently reviewed by me. Chest x-ray does not show any acute cardiopulmonary abnormality.  COVID testing performed.  Patient aware how to access results.  Current CDC guidance, isolation protocol and ED precautions reviewed with patient.  Advised supportive care with increasing rest and fluids and continue the cough syrup that she has been taking over-the-counter.  Sent azithromycin and ProAir inhaler to pharmacy since she does have history of COPD and may be having mild COPD exacerbation.  I did pend the prednisone because she is reluctant to take that medication since she states that she was on it for a long time due to her lupus and rheumatoid arthritis and tries not to take it.  Advised her to pick this medication up if she is not feeling any better over the next few days ER for symptoms worsen.  Return to ED precautions reviewed with patient.  Final Clinical Impressions(s) / UC Diagnoses   Final diagnoses:  COPD exacerbation (HCC)  Cough  Shortness of breath     Discharge Instructions     You have received COVID testing today either for positive exposure, concerning symptoms that could be related to COVID infection, screening purposes, or re-testing after confirmed positive.  Your test obtained today checks for active viral infection in the last 1-2 weeks. If your test is negative now, you can still test positive later. So, if you do develop symptoms you should either get re-tested and/or isolate x 5 days and then strict mask use x 5 days (unvaccinated) or mask use x 10 days (vaccinated). Please follow CDC guidelines.  While Rapid antigen tests come back in 15-20 minutes, send out PCR/molecular test results typically come back within 1-3 days. In the mean time, if you are symptomatic, assume this  could be a positive test and treat/monitor yourself as if you do have COVID.   We will call with test results if positive. Please download the MyChart app and  set up a profile to access test results.   If symptomatic, go home and rest. Push fluids. Take Tylenol as needed for discomfort. Gargle warm salt water. Throat lozenges. Take Mucinex DM or Robitussin for cough. Humidifier in bedroom to ease coughing. Warm showers. Also review the COVID handout for more information.  COVID-19 INFECTION: The incubation period of COVID-19 is approximately 14 days after exposure, with most symptoms developing in roughly 4-5 days. Symptoms may range in severity from mild to critically severe. Roughly 80% of those infected will have mild symptoms. People of any age may become infected with COVID-19 and have the ability to transmit the virus. The most common symptoms include: fever, fatigue, cough, body aches, headaches, sore throat, nasal congestion, shortness of breath, nausea, vomiting, diarrhea, changes in smell and/or taste.    COURSE OF ILLNESS Some patients may begin with mild disease which can progress quickly into critical symptoms. If your symptoms are worsening please call ahead to the Emergency Department and proceed there for further treatment. Recovery time appears to be roughly 1-2 weeks for mild symptoms and 3-6 weeks for severe disease.   GO IMMEDIATELY TO ER FOR FEVER YOU ARE UNABLE TO GET DOWN WITH TYLENOL, BREATHING PROBLEMS, CHEST PAIN, FATIGUE, LETHARGY, INABILITY TO EAT OR DRINK, ETC  QUARANTINE AND ISOLATION: To help decrease the spread of COVID-19 please remain isolated if you have COVID infection or are highly suspected to have COVID infection. This means -stay home and isolate to one room in the home if you live with others. Do not share a bed or bathroom with others while ill, sanitize and wipe down all countertops and keep common areas clean and disinfected. Stay home for 5 days. If you have no symptoms or your symptoms are resolving after 5 days, you can leave your house. Continue to wear a mask around others for 5 additional days. If you have been  in close contact (within 6 feet) of someone diagnosed with COVID 19, you are advised to quarantine in your home for 14 days as symptoms can develop anywhere from 2-14 days after exposure to the virus. If you develop symptoms, you  must isolate.  Most current guidelines for COVID after exposure -unvaccinated: isolate 5 days and strict mask use x 5 days. Test on day 5 is possible -vaccinated: wear mask x 10 days if symptoms do not develop -You do not necessarily need to be tested for COVID if you have + exposure and  develop symptoms. Just isolate at home x10 days from symptom onset During this global pandemic, CDC advises to practice social distancing, try to stay at least 866ft away from others at all times. Wear a face covering. Wash and sanitize your hands regularly and avoid going anywhere that is not necessary.  KEEP IN MIND THAT THE COVID TEST IS NOT 100% ACCURATE AND YOU SHOULD STILL DO EVERYTHING TO PREVENT POTENTIAL SPREAD OF VIRUS TO OTHERS (WEAR MASK, WEAR GLOVES, WASH HANDS AND SANITIZE REGULARLY). IF INITIAL TEST IS NEGATIVE, THIS MAY NOT MEAN YOU ARE DEFINITELY NEGATIVE. MOST ACCURATE TESTING IS DONE 5-7 DAYS AFTER EXPOSURE.   It is not advised by CDC to get re-tested after receiving a positive COVID test since you can still test positive for weeks to months after you have already cleared the virus.   *If  you have not been vaccinated for COVID, I strongly suggest you consider getting vaccinated as long as there are no contraindications.      ED Prescriptions    Medication Sig Dispense Auth. Provider   albuterol (VENTOLIN HFA) 108 (90 Base) MCG/ACT inhaler Inhale 1-2 puffs into the lungs every 6 (six) hours as needed for wheezing or shortness of breath. 8 g Eusebio FriendlyEaves, Roshawna Colclasure B, PA-C   azithromycin (ZITHROMAX) 250 MG tablet 2 tablets on day 1, then 1 tablet daily on days 2-5. 6 tablet Eusebio FriendlyEaves, Tonya Wantz B, PA-C   predniSONE (DELTASONE) 20 MG tablet Take 2 tablets (40 mg total) by mouth daily for 5  days. 10 tablet Gareth MorganEaves, Nelsy Madonna B, PA-C     PDMP not reviewed this encounter.   Shirlee Latchaves, Niamya Vittitow B, PA-C 12/26/20 1157    Shirlee LatchEaves, Adama Ferber B, PA-C 12/30/20 1857    Eusebio FriendlyEaves, Jamila Slatten B, PA-C 12/30/20 1900

## 2020-12-26 NOTE — ED Triage Notes (Signed)
Patient c/o cough and chest congestion and SOB that started a week ago.  Patient denies fevers.

## 2020-12-26 NOTE — Discharge Instructions (Addendum)

## 2020-12-27 LAB — SARS CORONAVIRUS 2 (TAT 6-24 HRS): SARS Coronavirus 2: POSITIVE — AB

## 2021-01-28 ENCOUNTER — Ambulatory Visit
Admission: EM | Admit: 2021-01-28 | Discharge: 2021-01-28 | Disposition: A | Discharge status: Home / Self Care | Payer: Medicare Other | Attending: Family Medicine | Admitting: Family Medicine

## 2021-01-28 ENCOUNTER — Other Ambulatory Visit: Payer: Self-pay

## 2021-01-28 DIAGNOSIS — N3001 Acute cystitis with hematuria: Secondary | ICD-10-CM | POA: Diagnosis not present

## 2021-01-28 DIAGNOSIS — R300 Dysuria: Secondary | ICD-10-CM | POA: Diagnosis not present

## 2021-01-28 LAB — URINALYSIS, COMPLETE (UACMP) WITH MICROSCOPIC
Bilirubin Urine: NEGATIVE
Glucose, UA: NEGATIVE mg/dL
Ketones, ur: NEGATIVE mg/dL
Nitrite: NEGATIVE
Protein, ur: 30 mg/dL — AB
Specific Gravity, Urine: 1.01 (ref 1.005–1.030)
WBC, UA: >50 WBC/hpf (ref 0–5)
pH: 6 (ref 5.0–8.0)

## 2021-01-28 MED ORDER — CEPHALEXIN 500 MG PO CAPS: 500.0000 mg | ORAL_CAPSULE | Freq: Two times a day (BID) | ORAL | 0 refills | Status: AC

## 2021-01-28 NOTE — Discharge Instructions (Signed)

## 2021-01-28 NOTE — ED Triage Notes (Signed)
2 days of painful urination and trouble holding her urine

## 2021-01-28 NOTE — ED Provider Notes (Signed)
[facility]    CSN: [id number] Arrival date & time: 01/28/21  1421      History   Chief Complaint Chief Complaint  Patient presents with  . Dysuria    HPI Laurie Weiss is a 80 y.o. female presenting for 2-day history of pain, frequency and urgency.  Patient denies any fever but does admit to some chills.  Patient admits to lower abdominal pain.  She does have chronic back pain and states that it is not any worse than it normally is.  Denies body aches, vomiting, vaginal discharge or abnormal vaginal bleeding.  Patient states she has had UTIs in the past and believes her symptoms are consistent with UTI.  She has taken aspirin but no other medication for symptoms.  No other complaints or concerns today.  HPI  Past Medical History:  Diagnosis Date  . Acid reflux disease   . Arthritis   . COPD (chronic obstructive pulmonary disease) (HCC)   . Esophageal spasm   . Lupus (HCC)     There are no problems to display for this patient.   Past Surgical History:  Procedure Laterality Date  . ABDOMINAL HYSTERECTOMY    . CESAREAN SECTION    . CHOLECYSTECTOMY      OB History   No obstetric history on file.      Home Medications    Prior to Admission medications   Medication Sig Start Date End Date Taking? Authorizing Provider  busPIRone (BUSPAR) 5 MG tablet 10 mg. 02/28/20  Yes [provider]  cephALEXin (KEFLEX) 500 MG capsule Take 1 capsule (500 mg total) by mouth 2 (two) times daily for 7 days. 01/28/21 02/04/21 Yes Shirlee Latch, PA-C  albuterol (VENTOLIN HFA) 108 (90 Base) MCG/ACT inhaler Inhale 1-2 puffs into the lungs every 6 (six) hours as needed for wheezing or shortness of breath. 12/26/20 01/25/21  Eusebio Friendly B, PA-C  amLODipine (NORVASC) 5 MG tablet Take by mouth. 04/17/09   [provider]  azithromycin (ZITHROMAX) 250 MG tablet 2 tablets on day 1, then 1 tablet daily on days 2-5. 12/26/20   Shirlee Latch, PA-C  Biotin 1 MG CAPS Take  by mouth.    [provider]  calcium-vitamin D (OSCAL 500/200 D-3) 500-200 MG-UNIT tablet Take by mouth. 04/17/09   [provider]  Cholecalciferol (VITAMIN D3) 25 MCG (1000 UT) CAPS Take by mouth.    [provider]  fexofenadine (ALLEGRA) 180 MG tablet Take by mouth. 01/03/14   [provider]  hydroxychloroquine (PLAQUENIL) 200 MG tablet Take by mouth. 05/01/13   [provider]  isosorbide mononitrate (IMDUR) 30 MG 24 hr tablet Take by mouth. 04/17/09   [provider]  methotrexate (RHEUMATREX) 2.5 MG tablet Take by mouth. 12/04/13   [provider]  nitrofurantoin, macrocrystal-monohydrate, (MACROBID) 100 MG capsule Take 1 capsule (100 mg total) by mouth 2 (two) times daily. 06/23/19   Payton Mccallum, MD  nitroGLYCERIN (NITROSTAT) 0.4 MG SL tablet 1 tablet(s) sublingual as needed for chest pain (may repeat every 5 minutes but seek medical help if pain persists after 3 tablets) 04/17/09   [provider]    Family History Family History  Problem Relation Age of Onset  . Cancer Mother   . Cancer Father   . Cancer Sister     Social History Social History   Tobacco Use  . Smoking status: Former Games developer  . Smokeless tobacco: Never Used  Vaping Use  . Vaping Use:  Never used  Substance Use Topics  . Alcohol use: Never  . Drug use: Never     Allergies   Amoxicillin-pot clavulanate, Aspirin-dipyridamole er, Ciclesonide, Clopidogrel, Codeine, Doxycycline, and Tramadol   Review of Systems Review of Systems  Constitutional: Positive for chills. Negative for fatigue and fever.  Gastrointestinal: Positive for abdominal pain. Negative for diarrhea, nausea and vomiting.  Genitourinary: Positive for dysuria, frequency and urgency. Negative for decreased urine volume, flank pain, hematuria, pelvic pain, vaginal bleeding, vaginal discharge and vaginal pain.  Musculoskeletal: Positive for back pain (chronic).  Skin: Negative  for rash.     Physical Exam Triage Vital Signs ED Triage Vitals  Enc Vitals Group     BP 01/28/21 1435 (!) 125/59     Pulse Rate 01/28/21 1435 66     Resp 01/28/21 1435 16     Temp 01/28/21 1435 98 F (36.7 C)     Temp Source 01/28/21 1435 Oral     SpO2 01/28/21 1435 99 %     Weight 01/28/21 1431 169 lb 15.6 oz (77.1 kg)     Height 01/28/21 1431 5' (1.524 m)     Head Circumference --      Peak Flow --      Pain Score 01/28/21 1431 5     Pain Loc --      Pain Edu? --      Excl. in GC? --    No data found.  Updated Vital Signs BP (!) 125/59 (BP Location: Right Arm)   Pulse 66   Temp 98 F (36.7 C) (Oral)   Resp 16   Ht 5' (1.524 m)   Wt 169 lb 15.6 oz (77.1 kg)   SpO2 99%   BMI 33.20 kg/m    Physical Exam Vitals and nursing note reviewed.  Constitutional:      General: She is not in acute distress.    Appearance: Normal appearance. She is not ill-appearing or toxic-appearing.  HENT:     Head: Normocephalic and atraumatic.  Eyes:     General: No scleral icterus.       Right eye: No discharge.        Left eye: No discharge.     Conjunctiva/sclera: Conjunctivae normal.  Cardiovascular:     Rate and Rhythm: Normal rate and regular rhythm.     Heart sounds: Normal heart sounds.  Pulmonary:     Effort: Pulmonary effort is normal. No respiratory distress.     Breath sounds: Normal breath sounds.  Abdominal:     Palpations: Abdomen is soft.     Tenderness: There is abdominal tenderness (mild suprapubic TTP). There is no right CVA tenderness or left CVA tenderness.  Musculoskeletal:     Cervical back: Neck supple.  Skin:    General: Skin is dry.  Neurological:     General: No focal deficit present.     Mental Status: She is alert. Mental status is at baseline.     Motor: No weakness.     Gait: Gait normal.  Psychiatric:        Mood and Affect: Mood normal.        Behavior: Behavior normal.        Thought Content: Thought content normal.      UC  Treatments / Results  Labs (all labs ordered are listed, but only abnormal results are displayed) Labs Reviewed  URINALYSIS, COMPLETE (UACMP) WITH MICROSCOPIC - Abnormal; Notable for the following components:  Result Value   Color, Urine STRAW (*)    APPearance HAZY (*)    Hgb urine dipstick MODERATE (*)    Protein, ur 30 (*)    Leukocytes,Ua MODERATE (*)    Non Squamous Epithelial PRESENT (*)    Bacteria, UA RARE (*)    All other components within normal limits  URINE CULTURE    EKG   Radiology No results found.  Procedures Procedures (including critical care time)  Medications Ordered in UC Medications - No data to display  Initial Impression / Assessment and Plan / UC Course  I have reviewed the triage vital signs and the nursing notes.  Pertinent labs & imaging results that were available during my care of the patient were reviewed by me and considered in my medical decision making (see chart for details).   80 year old female presenting for dysuria, urinary frequency urgency. In the clinic, all vital signs are stable. Exam significant for mild suprapubic tenderness. Urinalysis performed today shows hazy urine, moderate blood, protein, moderate leukocytes. Will send urine for culture and treat for UTI based on symptoms and urinalysis result. Sent Keflex. Patient has taken this medication in the past and done well. She does have allergy to Augmentin listed, but the reaction is nausea and vomiting. Patient fine to take Keflex. Advised increased rest and fluids. Supportive care. ED precautions reviewed.   Final Clinical Impressions(s) / UC Diagnoses   Final diagnoses:  Acute cystitis with hematuria  Dysuria     Discharge Instructions     UTI: Based on either symptoms or urinalysis, you may have a urinary tract infection. We will send the urine for culture and call with results in a few days. Begin antibiotics at this time. Your symptoms should be much improved over  the next 2-3 days. Increase rest and fluid intake. If for some reason symptoms are worsening or not improving after a couple of days or the urine culture determines the antibiotics you are taking will not treat the infection, the antibiotics may be changed. Return or go to ER for fever, back pain, worsening urinary pain, discharge, increased blood in urine. May take Tylenol or Motrin OTC for pain relief or consider AZO if no contraindications     ED Prescriptions    Medication Sig Dispense Auth. Provider   cephALEXin (KEFLEX) 500 MG capsule Take 1 capsule (500 mg total) by mouth 2 (two) times daily for 7 days. 14 capsule Shirlee Latch, PA-C     PDMP not reviewed this encounter.   Shirlee Latch, PA-C 01/28/21 1517

## 2021-01-31 LAB — URINE CULTURE: Culture: >=100000 — AB

## 2021-12-13 ENCOUNTER — Ambulatory Visit
Admission: EM | Admit: 2021-12-13 | Discharge: 2021-12-13 | Disposition: A | Discharge status: Home / Self Care | Payer: Medicare Other | Attending: Emergency Medicine | Admitting: Emergency Medicine

## 2021-12-13 ENCOUNTER — Ambulatory Visit (INDEPENDENT_AMBULATORY_CARE_PROVIDER_SITE_OTHER): Payer: Medicare Other

## 2021-12-13 ENCOUNTER — Encounter: Payer: Self-pay | Admitting: Emergency Medicine

## 2021-12-13 ENCOUNTER — Other Ambulatory Visit: Payer: Self-pay

## 2021-12-13 DIAGNOSIS — S52502A Unspecified fracture of the lower end of left radius, initial encounter for closed fracture: Secondary | ICD-10-CM

## 2021-12-13 DIAGNOSIS — S52592A Other fractures of lower end of left radius, initial encounter for closed fracture: Secondary | ICD-10-CM | POA: Diagnosis not present

## 2021-12-13 NOTE — Discharge Instructions (Addendum)
You have broken your distal radius.  Please wear the splint until you are evaluated by hand.  You may also follow-up with a hand surgeon at [facility] if that works out better for you.  May take 500 to 1000 mg of Tylenol 3 times a day as needed for pain.  Ice, elevate.

## 2021-12-13 NOTE — ED Triage Notes (Signed)
Patient states that she was walking in her bedroom and fell this morning.  Patient states that she hit her left wrist on the TV stand.  Patient denies hitting her head.  Patient has swelling and bruising at her left wrist.

## 2021-12-13 NOTE — ED Provider Notes (Signed)
HPI  SUBJECTIVE:  Laurie Weiss is a right-handed 80 y.o. female who presents with left radial wrist pain, swelling after falling on an outstretched hand earlier today.  She states that she twisted her foot while walking, and fell.  She reports swelling and tenderness at the radius.  No limitation of motion, numbness tingling in her hand, bruising.  She denies hitting her head, preceding chest pain, palpitations, syncope causing her to fall.  She has tried a wrist splint and Tylenol with improvement in her symptoms.  Symptoms worse with palpation.  She has a past medical history of rheumatoid arthritis, lupus, has been on prolonged courses of steroids in the past.  No history of osteoporosis, chronic kidney disease, diabetes, hypertension.  PMD: [facility].    Past Medical History:  Diagnosis Date   Acid reflux disease    Arthritis    COPD (chronic obstructive pulmonary disease) (HCC)    Esophageal spasm    Lupus (HCC)     Past Surgical History:  Procedure Laterality Date   ABDOMINAL HYSTERECTOMY     CESAREAN SECTION     CHOLECYSTECTOMY      Family History  Problem Relation Age of Onset   Cancer Mother    Cancer Father    Cancer Sister     Social History   Tobacco Use   Smoking status: Former   Smokeless tobacco: Never  Building services engineer Use: Never used  Substance Use Topics   Alcohol use: Never   Drug use: Never    No current facility-administered medications for this encounter.  Current Outpatient Medications:    etanercept (ENBREL) 50 MG/ML injection, Inject into the skin., Disp: , Rfl:    albuterol (VENTOLIN HFA) 108 (90 Base) MCG/ACT inhaler, Inhale 1-2 puffs into the lungs every 6 (six) hours as needed for wheezing or shortness of breath., Disp: 8 g, Rfl: 1   amLODipine (NORVASC) 5 MG tablet, Take by mouth., Disp: , Rfl:    Biotin 1 MG CAPS, Take by mouth., Disp: , Rfl:    busPIRone (BUSPAR) 5 MG tablet, 10 mg., Disp: , Rfl:     calcium-vitamin D (OSCAL 500/200 D-3) 500-200 MG-UNIT tablet, Take by mouth., Disp: , Rfl:    Cholecalciferol (VITAMIN D3) 25 MCG (1000 UT) CAPS, Take by mouth., Disp: , Rfl:    fexofenadine (ALLEGRA) 180 MG tablet, Take by mouth., Disp: , Rfl:    hydroxychloroquine (PLAQUENIL) 200 MG tablet, Take by mouth., Disp: , Rfl:    isosorbide mononitrate (IMDUR) 30 MG 24 hr tablet, Take by mouth., Disp: , Rfl:    methotrexate (RHEUMATREX) 2.5 MG tablet, Take by mouth., Disp: , Rfl:    nitroGLYCERIN (NITROSTAT) 0.4 MG SL tablet, 1 tablet(s) sublingual as needed for chest pain (may repeat every 5 minutes but seek medical help if pain persists after 3 tablets), Disp: , Rfl:   Allergies  Allergen Reactions   Amoxicillin-Pot Clavulanate Nausea And Vomiting   Aspirin-Dipyridamole Er Other (See Comments)   Ciclesonide Nausea Only   Clopidogrel Hives   Codeine Nausea And Vomiting   Doxycycline Other (See Comments)    Other Reaction: N/V   Tramadol Other (See Comments)    Other reaction(s): VOMITING     ROS  As noted in HPI.   Physical Exam  BP 127/74 (BP Location: Right Arm)    Pulse 77    Temp 97.6 F (36.4 C) (Oral)    Resp 14    Ht 5' (1.524  m)    Wt 79.4 kg    SpO2 96%    BMI 34.18 kg/m   Constitutional: Well developed, well nourished, no acute distress Eyes:  EOMI, conjunctiva normal bilaterally HENT: Normocephalic, atraumatic,mucus membranes moist Respiratory: Normal inspiratory effort Cardiovascular: Normal rate GI: nondistended skin: No rash, skin intact Musculoskeletal: Tender swelling distal left radius.  Positive left radial tenderness.  distal ulnar styloid NT , snuffbox NT, carpals NT, metacarpals NT, digits NT, TFCC NT.  No pain with supination,  no pain with pronation, no pain with radial / ulnar deviation.  Sensation motor intact in median/radial/ulnar distribution. Rp 2+.  No bruising, erythema. Elbow and proximal forearm NT.  Neurologic: Alert & oriented x 3, no focal neuro  deficits Psychiatric: Speech and behavior appropriate   ED Course   Medications - No data to display  Orders Placed This Encounter  Procedures   DG Wrist Complete Left    Standing Status:   Standing    Number of Occurrences:   1    Order Specific Question:   Reason for Exam (SYMPTOM  OR DIAGNOSIS REQUIRED)    Answer:   FOOSH radial tenderness, swelling rule out fracture   Apply other splint    Standing Status:   Standing    Number of Occurrences:   1    Order Specific Question:   Laterality    Answer:   Left    Order Specific Question:   Splint type    Answer:   Sugar-tong    No results found for this or any previous visit (from the past 24 hour(s)). DG Wrist Complete Left  Result Date: 12/13/2021 CLINICAL DATA:  Fall on outstretched arm. Swelling. Rule out fracture. EXAM: LEFT WRIST - COMPLETE 3+ VIEW COMPARISON:  None. FINDINGS: On the scratch set there is dorsal soft tissue swelling. There is a nondisplaced fracture involving the distal metaphysis of the radius, seen only on the lateral projection images. No significant angulation. No dislocation. Small well corticated ossicle is identified adjacent to the ulnar styloid. Degenerative changes noted at the basilar joint. IMPRESSION: Nondisplaced fracture involves the distal metaphysis of the radius. Dorsal soft tissue swelling. Electronically Signed   By: Signa Kell M.D.   On: 12/13/2021 09:37    ED Clinical Impression  1. Other closed fracture of distal end of left radius, initial encounter      ED Assessment/Plan  Patient with bony tenderness, swelling post mechanical fall.  X-ray wrist.  She has a splint already.  Reviewed imaging independently.  Nondisplaced fracture of the distal metaphysis of the radius with dorsal soft tissue swelling.  See radiology report for full details.  Patient with a small radial fracture. Applying sugar-tong splint. Suspect it will heal well with splinting or casting, however will refer  to hand for further management within the week.  May follow-up with Dr. Mathis Bud in [place], Dr. Amanda Pea, or Dr. Melvyn Novas in [place], or with a hand surgeon of her choice.  Discussed imaging, MDM, treatment plan, and plan for follow-up with patient. Discussed sn/sx that should prompt return to the ED. patient agrees with plan.   No orders of the defined types were placed in this encounter.     *This clinic note was created using Dragon dictation software. Therefore, there may be occasional mistakes despite careful proofreading.  ?    Domenick Gong, MD 12/15/21 [phone]

## 2023-06-21 IMAGING — CR DG WRIST COMPLETE 3+V*L*
5 series · 5 of 5 positions shown · non-contrast
Comparison: None.

CLINICAL DATA: Fall on outstretched arm. Swelling. Rule out
fracture.

EXAM:
LEFT WRIST - COMPLETE 3+ VIEW

[wrist pa]
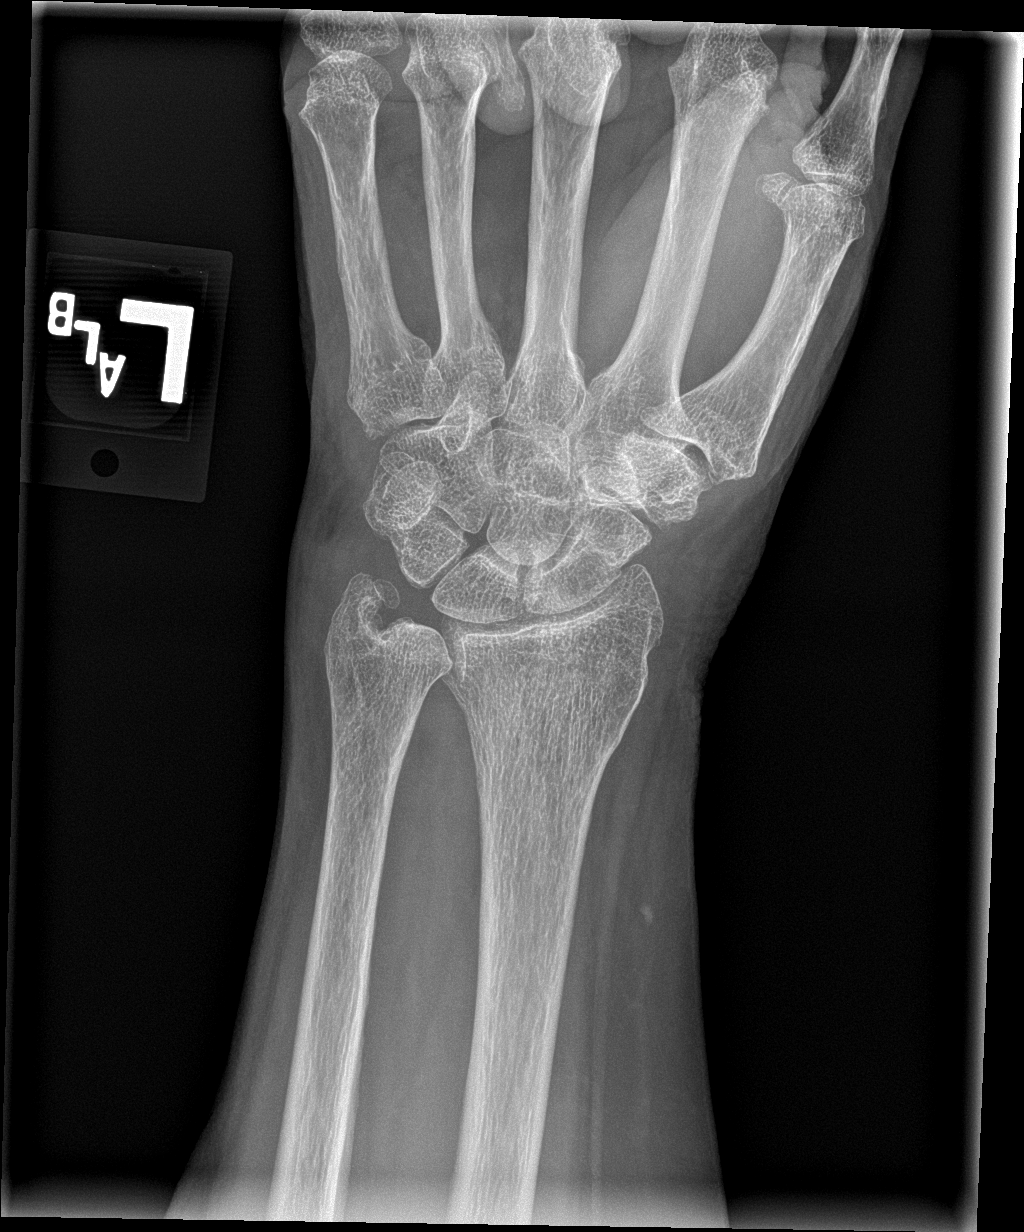

[wrist obl]
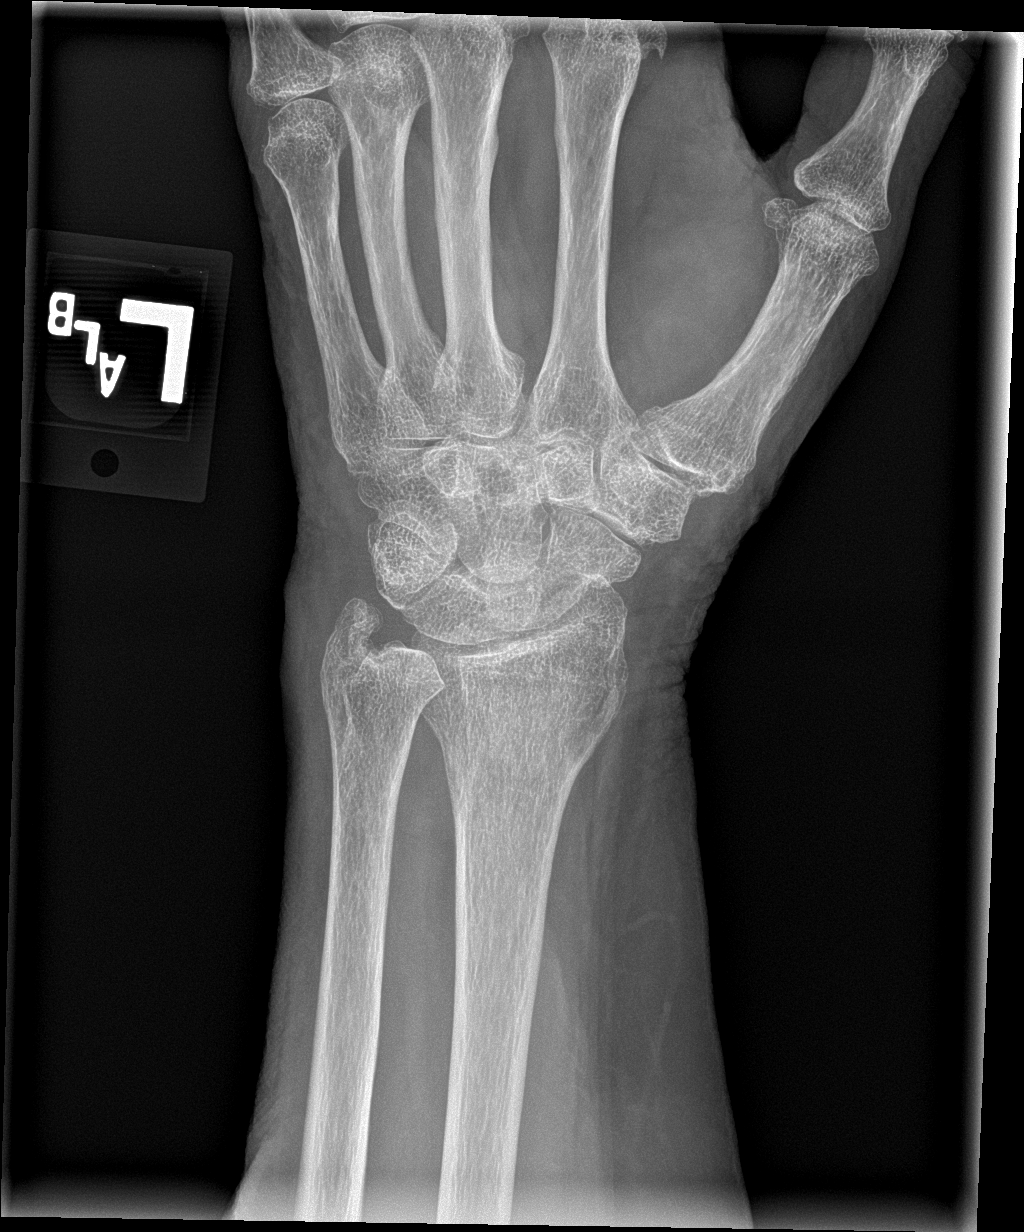

[wrist lat (1 of 2)]
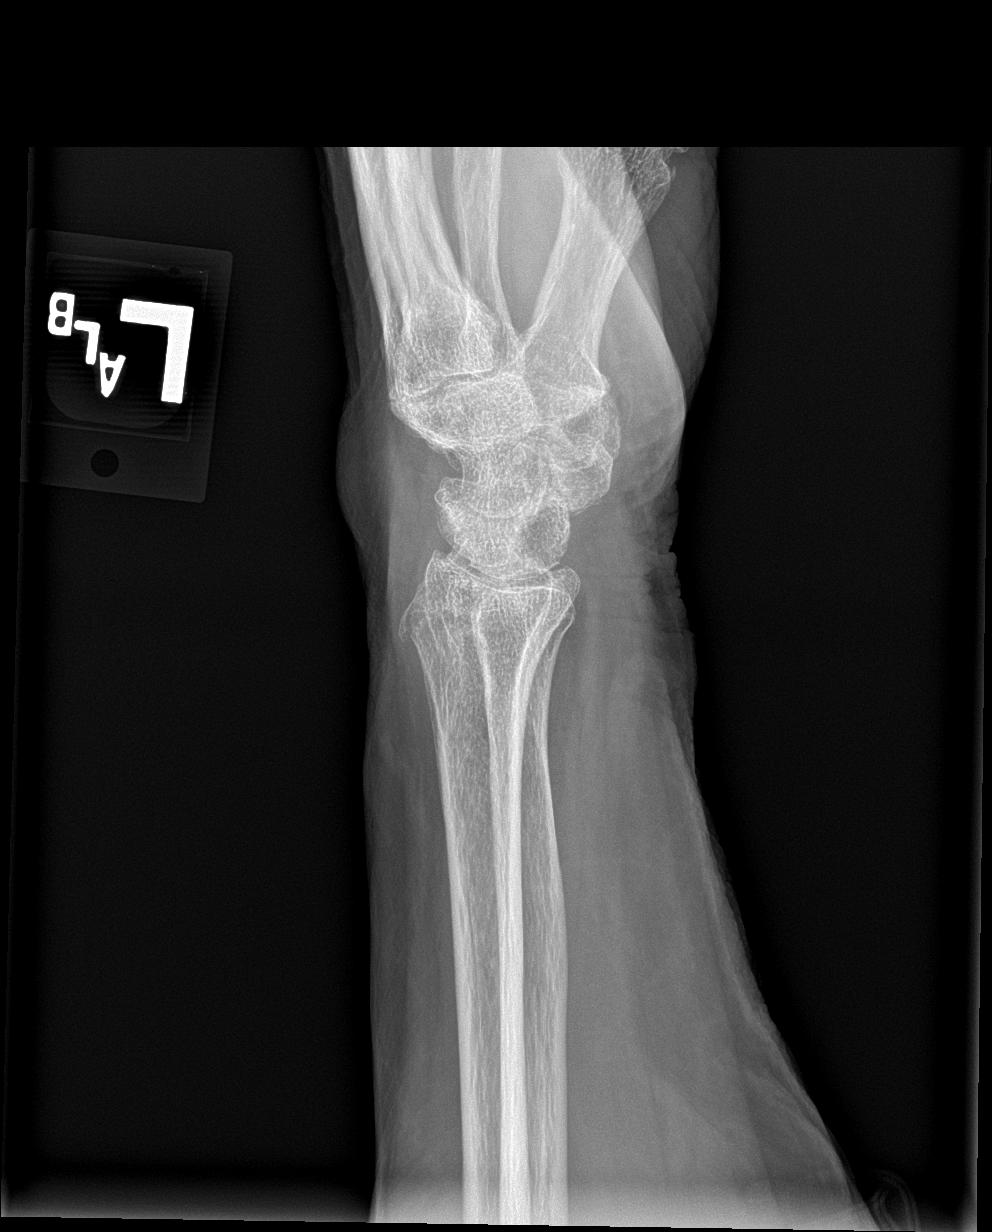

[wrist navicular]
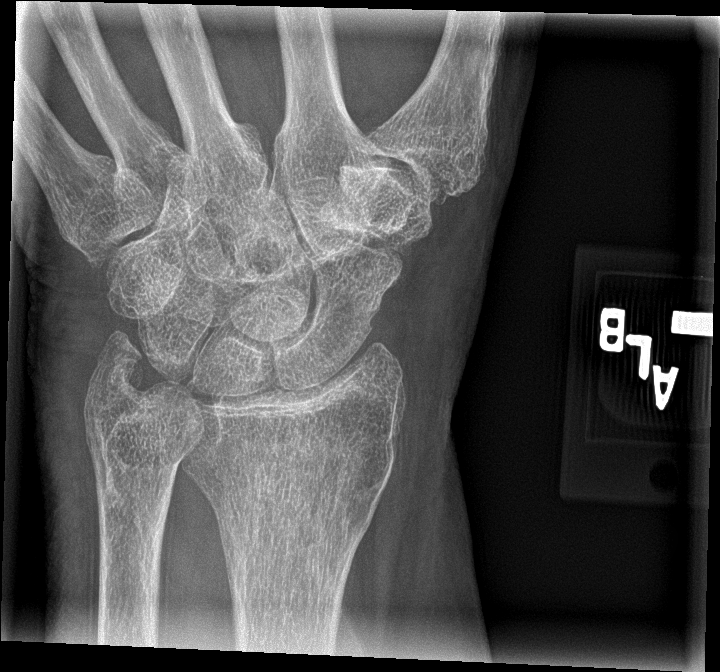

[wrist lat (2 of 2)]
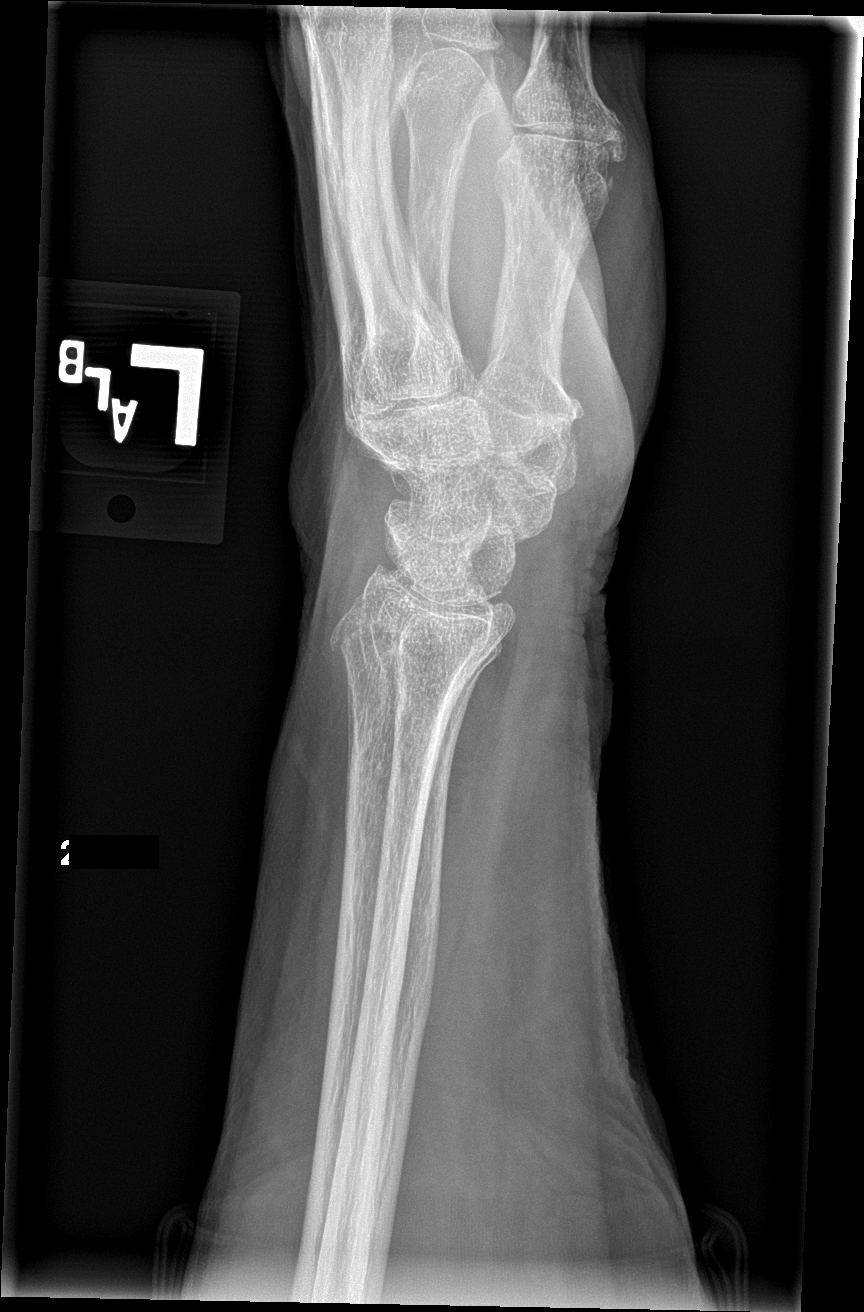

[5 of 5 positions shown; findings below may reference images not displayed]

FINDINGS: On the scratch set there is dorsal soft tissue swelling. There is a
nondisplaced fracture involving the distal metaphysis of the radius,
seen only on the lateral projection images. No significant
angulation. No dislocation. Small well corticated ossicle is
identified adjacent to the ulnar styloid. Degenerative changes noted
at the basilar joint.
IMPRESSION: Nondisplaced fracture involves the distal metaphysis of the radius.
Dorsal soft tissue swelling.
